# Patient Record
Sex: Male | Born: 1960
Health system: Southern US, Community
[De-identification: ages and names within clinical notes are randomized; demographics above are authoritative.]

---

## 2001-10-03 ENCOUNTER — Encounter: Admission: RE | Admit: 2001-10-03 | Discharge: 2001-10-03 | Payer: Self-pay | Admitting: Family Medicine

## 2001-10-03 ENCOUNTER — Encounter: Payer: Self-pay | Admitting: Family Medicine

## 2008-04-08 ENCOUNTER — Encounter: Admission: RE | Admit: 2008-04-08 | Discharge: 2008-04-08 | Payer: Self-pay | Admitting: Family Medicine

## 2009-04-27 ENCOUNTER — Encounter: Admission: RE | Admit: 2009-04-27 | Discharge: 2009-04-27 | Payer: Self-pay | Admitting: Family Medicine

## 2009-10-07 ENCOUNTER — Encounter: Admission: RE | Admit: 2009-10-07 | Discharge: 2009-10-07 | Payer: Self-pay | Admitting: Family Medicine

## 2010-03-14 ENCOUNTER — Encounter: Admission: RE | Admit: 2010-03-14 | Discharge: 2010-03-14 | Payer: Self-pay | Admitting: Gastroenterology

## 2010-12-21 IMAGING — CR DG UGI W/ HIGH DENSITY W/KUB
12 series · 12 of 12 positions shown · non-contrast
Comparison: None.

CLINICAL DATA: Vomiting liquids.

UPPER GI SERIES WITH KUB
TECHNIQUE: Routine upper GI series was performed with high density
barium.
Fluoroscopy Time: 1.8 minutes

[run (1 of 11)]
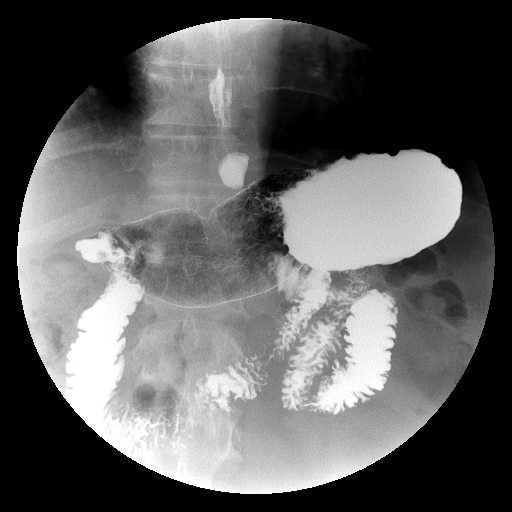

[run (2 of 11)]
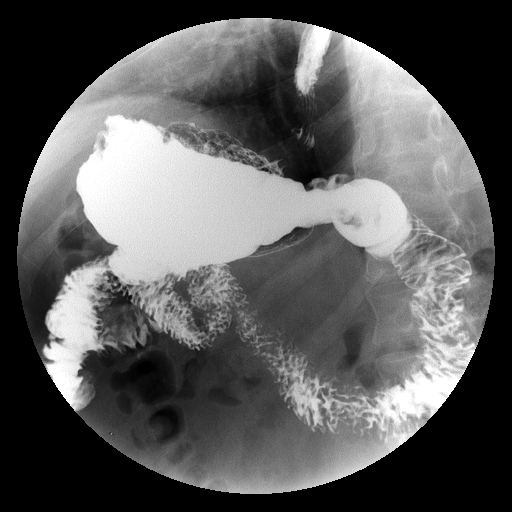

[run (3 of 11)]
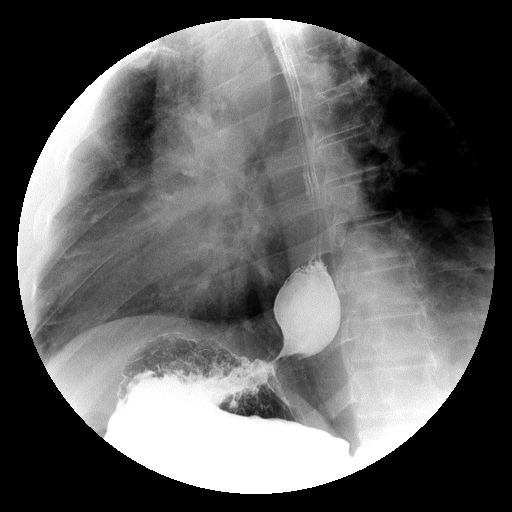

[run (4 of 11)]
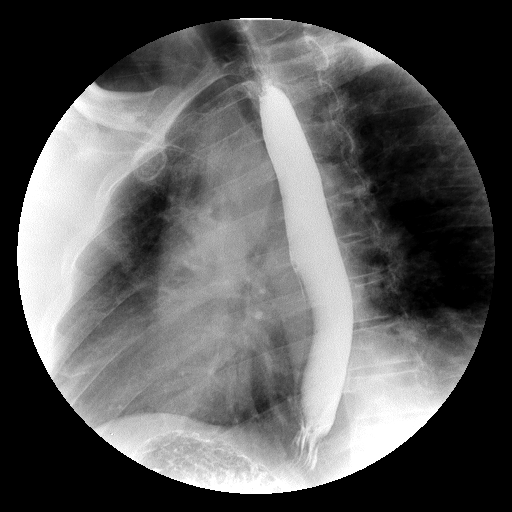

[run (5 of 11)]
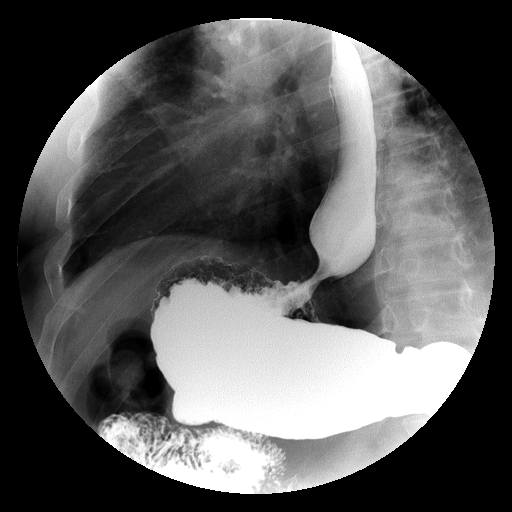

[run (6 of 11)]
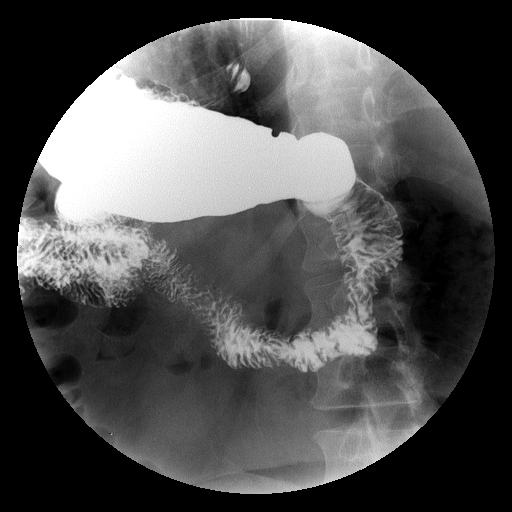

[run (7 of 11)]
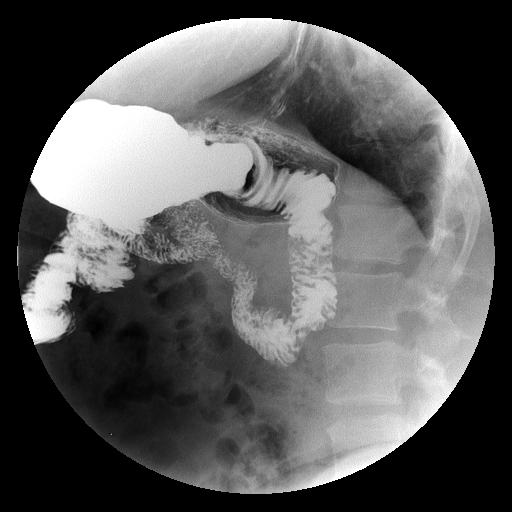

[run (8 of 11)]
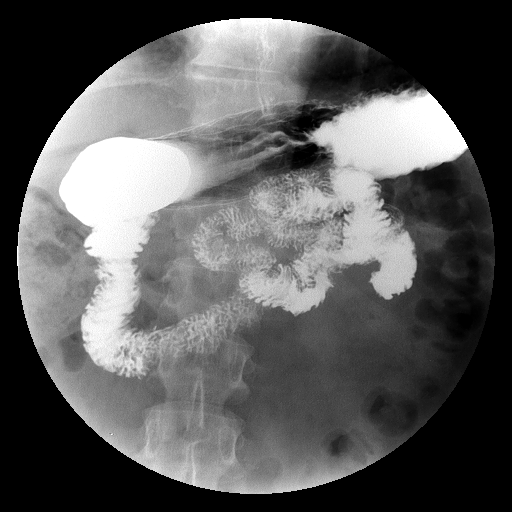

[run (9 of 11)]
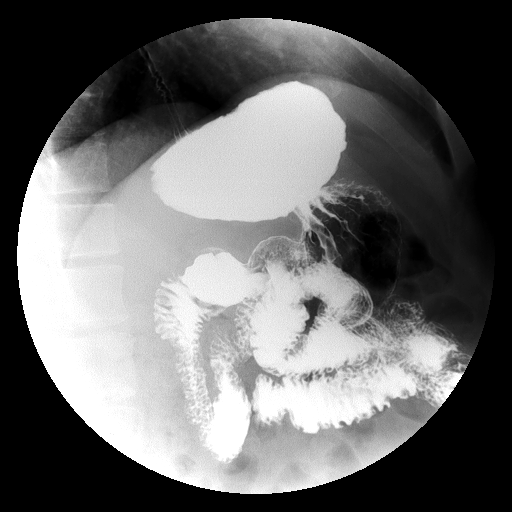

[run (10 of 11)]
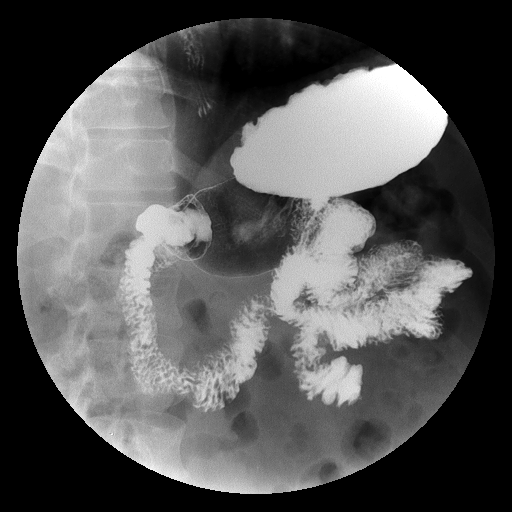

[run (11 of 11)]
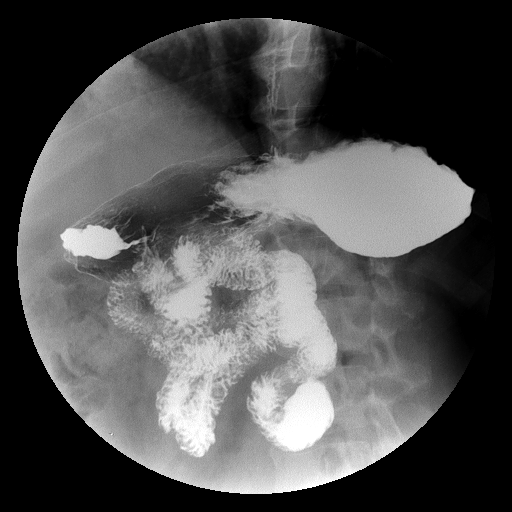

[view not recorded]
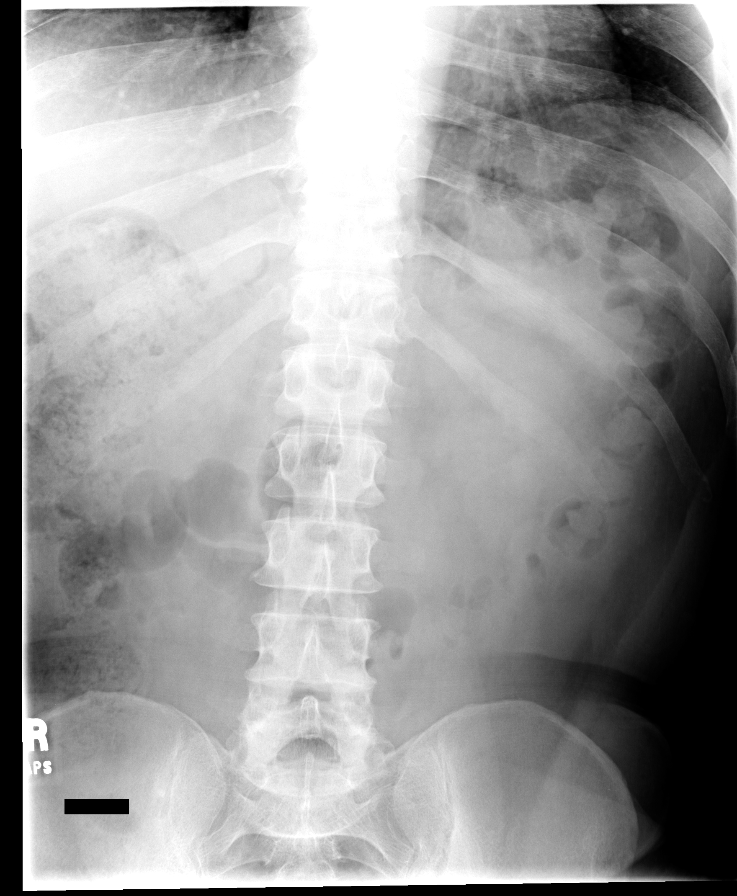

[12 of 12 positions shown; findings below may reference images not displayed]

FINDINGS: Preliminary film is unremarkable.  Normal esophageal
motility and contours.  No stricture.  Minimal sliding type hiatal
hernia.  No GE reflux was appreciated at fluoroscopy.  No gastric
or duodenal abnormality.
IMPRESSION: Minimal hiatal hernia.  Normal esophageal motility.  Normal stomach
and duodenum.

## 2016-04-02 DIAGNOSIS — K080 Exfoliation of teeth due to systemic causes: Secondary | ICD-10-CM | POA: Diagnosis not present

## 2016-04-07 DIAGNOSIS — K080 Exfoliation of teeth due to systemic causes: Secondary | ICD-10-CM | POA: Diagnosis not present

## 2016-05-06 DIAGNOSIS — R21 Rash and other nonspecific skin eruption: Secondary | ICD-10-CM | POA: Diagnosis not present

## 2016-08-26 DIAGNOSIS — E669 Obesity, unspecified: Secondary | ICD-10-CM | POA: Diagnosis not present

## 2016-08-26 DIAGNOSIS — Z23 Encounter for immunization: Secondary | ICD-10-CM | POA: Diagnosis not present

## 2016-08-26 DIAGNOSIS — Z125 Encounter for screening for malignant neoplasm of prostate: Secondary | ICD-10-CM | POA: Diagnosis not present

## 2016-08-26 DIAGNOSIS — Z0000 Encounter for general adult medical examination without abnormal findings: Secondary | ICD-10-CM | POA: Diagnosis not present

## 2016-08-26 DIAGNOSIS — E039 Hypothyroidism, unspecified: Secondary | ICD-10-CM | POA: Diagnosis not present

## 2016-08-26 DIAGNOSIS — Z1211 Encounter for screening for malignant neoplasm of colon: Secondary | ICD-10-CM | POA: Diagnosis not present

## 2016-08-26 DIAGNOSIS — E782 Mixed hyperlipidemia: Secondary | ICD-10-CM | POA: Diagnosis not present

## 2016-10-08 DIAGNOSIS — K080 Exfoliation of teeth due to systemic causes: Secondary | ICD-10-CM | POA: Diagnosis not present

## 2016-10-29 DIAGNOSIS — K080 Exfoliation of teeth due to systemic causes: Secondary | ICD-10-CM | POA: Diagnosis not present

## 2017-03-03 DIAGNOSIS — E782 Mixed hyperlipidemia: Secondary | ICD-10-CM | POA: Diagnosis not present

## 2017-03-03 DIAGNOSIS — E669 Obesity, unspecified: Secondary | ICD-10-CM | POA: Diagnosis not present

## 2017-03-03 DIAGNOSIS — E039 Hypothyroidism, unspecified: Secondary | ICD-10-CM | POA: Diagnosis not present

## 2017-03-12 DIAGNOSIS — R7301 Impaired fasting glucose: Secondary | ICD-10-CM | POA: Diagnosis not present

## 2017-03-18 DIAGNOSIS — R7303 Prediabetes: Secondary | ICD-10-CM | POA: Diagnosis not present

## 2017-04-22 DIAGNOSIS — K080 Exfoliation of teeth due to systemic causes: Secondary | ICD-10-CM | POA: Diagnosis not present

## 2017-05-05 DIAGNOSIS — E1165 Type 2 diabetes mellitus with hyperglycemia: Secondary | ICD-10-CM | POA: Diagnosis not present

## 2017-05-05 DIAGNOSIS — M7060 Trochanteric bursitis, unspecified hip: Secondary | ICD-10-CM | POA: Diagnosis not present

## 2017-05-17 DIAGNOSIS — E119 Type 2 diabetes mellitus without complications: Secondary | ICD-10-CM | POA: Diagnosis not present

## 2017-06-17 DIAGNOSIS — R21 Rash and other nonspecific skin eruption: Secondary | ICD-10-CM | POA: Diagnosis not present

## 2017-06-17 DIAGNOSIS — B353 Tinea pedis: Secondary | ICD-10-CM | POA: Diagnosis not present

## 2017-06-22 DIAGNOSIS — R21 Rash and other nonspecific skin eruption: Secondary | ICD-10-CM | POA: Diagnosis not present

## 2017-07-07 DIAGNOSIS — L308 Other specified dermatitis: Secondary | ICD-10-CM | POA: Diagnosis not present

## 2017-09-15 DIAGNOSIS — E119 Type 2 diabetes mellitus without complications: Secondary | ICD-10-CM | POA: Diagnosis not present

## 2017-09-15 DIAGNOSIS — Z125 Encounter for screening for malignant neoplasm of prostate: Secondary | ICD-10-CM | POA: Diagnosis not present

## 2017-09-15 DIAGNOSIS — E782 Mixed hyperlipidemia: Secondary | ICD-10-CM | POA: Diagnosis not present

## 2017-09-15 DIAGNOSIS — E039 Hypothyroidism, unspecified: Secondary | ICD-10-CM | POA: Diagnosis not present

## 2017-09-15 DIAGNOSIS — Z0000 Encounter for general adult medical examination without abnormal findings: Secondary | ICD-10-CM | POA: Diagnosis not present

## 2017-09-15 DIAGNOSIS — Z23 Encounter for immunization: Secondary | ICD-10-CM | POA: Diagnosis not present

## 2017-09-21 DIAGNOSIS — R319 Hematuria, unspecified: Secondary | ICD-10-CM | POA: Diagnosis not present

## 2017-11-11 DIAGNOSIS — K080 Exfoliation of teeth due to systemic causes: Secondary | ICD-10-CM | POA: Diagnosis not present

## 2018-03-23 DIAGNOSIS — E039 Hypothyroidism, unspecified: Secondary | ICD-10-CM | POA: Diagnosis not present

## 2018-03-23 DIAGNOSIS — E782 Mixed hyperlipidemia: Secondary | ICD-10-CM | POA: Diagnosis not present

## 2018-03-23 DIAGNOSIS — E119 Type 2 diabetes mellitus without complications: Secondary | ICD-10-CM | POA: Diagnosis not present

## 2018-05-25 DIAGNOSIS — M7661 Achilles tendinitis, right leg: Secondary | ICD-10-CM | POA: Diagnosis not present

## 2018-05-31 DIAGNOSIS — E119 Type 2 diabetes mellitus without complications: Secondary | ICD-10-CM | POA: Diagnosis not present

## 2018-06-02 ENCOUNTER — Ambulatory Visit: Payer: Self-pay | Admitting: *Deleted

## 2018-06-02 ENCOUNTER — Encounter: Payer: Self-pay | Admitting: Sports Medicine

## 2018-06-02 ENCOUNTER — Ambulatory Visit: Payer: Self-pay

## 2018-06-02 ENCOUNTER — Ambulatory Visit: Payer: Federal, State, Local not specified - PPO | Admitting: Sports Medicine

## 2018-06-02 VITALS — BP 118/86 | HR 71 | Ht 68.25 in | Wt 209.0 lb

## 2018-06-02 DIAGNOSIS — M7661 Achilles tendinitis, right leg: Secondary | ICD-10-CM | POA: Diagnosis not present

## 2018-06-02 DIAGNOSIS — M7660 Achilles tendinitis, unspecified leg: Secondary | ICD-10-CM

## 2018-06-02 DIAGNOSIS — M6788 Other specified disorders of synovium and tendon, other site: Secondary | ICD-10-CM

## 2018-06-02 MED ORDER — NITROGLYCERIN 0.2 MG/HR TD PT24: MEDICATED_PATCH | TRANSDERMAL | 1 refills | Status: AC

## 2018-06-02 NOTE — Progress Notes (Signed)
LIMITED MSK ULTRASOUND OF Right achilles Images were obtained and interpreted by myself, Gaspar BiddingMichael Aubre Quincy, DO  Images have been saved and stored to PACS system. Images obtained on: GE S7 Ultrasound machine  FINDINGS:   0.62cm at its greatest diameter however most significant findings are at the insertion of the Achilles with significant fragmentation consistent with insertional tendinitis  IMPRESSION:  1. Achilles tendinopathy with overlying insertional tendinitis

## 2018-06-02 NOTE — Telephone Encounter (Signed)
Patient called with questions regarding taking erectile dysfunction medication while he is using nitroglycerin patch prescribed by Dr. Berline Choughigby for achilles tendon healing. Advised not to use both of the medications at the same time. Answered all questions for patient. His PCP is Cam HaiKimberly Shaw at [facility].

## 2018-06-02 NOTE — Progress Notes (Signed)
Veverly FellsMichael D. [name], DO  [facility] [phone]  James Reeves - 57 y.o. male MRN [id number]  Date of birth: 02-23-1961  Visit Date: 06/02/2018  PCP: Lupita RaiderShaw, Kimberlee, MD   Referred by: Lupita RaiderShaw, Kimberlee, MD  Scribe(s) for today's visit: Christoper FabianMolly Weber, LAT, ATC  SUBJECTIVE:  James Reeves is here for New Patient (Initial Visit) (R Achille's pain) .  Referred by: Dr. Cam HaiKimberly Shaw  His R Achille's pain symptoms INITIALLY: Began about 3 months ago w/ no specific MOI.  May be due to walking for exercise while wearing some [place] boat shoes. Described as moderate tightness that is worse upon initially getting up from a seated or supine position, nonradiating Worsened with squatting Improved with walking Additional associated symptoms include: no swelling noted; no mechanical symptoms noted; no N/T     At this time symptoms are improving compared to onset  He has not been doing much in terms of treatment for his R Achille's.   His L foot pain (sup and lat L foot) symptoms INITIALLY: Began about 2.5 weeks ago while he was bowling.  He recalls no specific MOI but may have rolled his L foot and ankle when he stepped on the lane divider while bowling.  He reports not noticing the pain until the end of the evening while he was bowling. Described as moderate burning pain, nonradiating Worsened with walking and weight bearing activity Improved with rest and sleep Additional associated symptoms include: no swelling and no N/T noted in the L foot    At this time symptoms show no change compared to onset  He has not been doing anything for the L foot.   REVIEW OF SYSTEMS: Denies night time disturbances. Denies fevers, chills, or night sweats. Denies unexplained weight loss. Denies personal history of cancer. Denies changes in bowel or bladder habits. Denies recent unreported falls. Denies new or worsening dyspnea or wheezing. Denies  headaches or dizziness.  Denies numbness, tingling or weakness  In the extremities.  Denies dizziness or presyncopal episodes Denies lower extremity edema    HISTORY:  Prior history reviewed and updated per electronic medical record.  Social History   Occupational History  . Not on file  Tobacco Use  . Smoking status: Never Smoker  . Smokeless tobacco: Never Used  Substance and Sexual Activity  . Alcohol use: Not on file  . Drug use: Not on file  . Sexual activity: Not on file   Social History   Social History Narrative  . Not on file    History reviewed. No pertinent past medical history. History reviewed. No pertinent surgical history. family history is not on file.  DATA OBTAINED & REVIEWED:  No results for input(s): HGBA1C, LABURIC, CREATINE in the last 8760 hours. .   OBJECTIVE:  VS:  HT:5' 8.25" (173.4 cm)   WT:209 lb (94.8 kg)  BMI:31.53    BP:118/86  HR:71bpm  TEMP: ( )  RESP:97 %   PHYSICAL EXAM: CONSTITUTIONAL: Well-developed, Well-nourished and In no acute distress PSYCHIATRIC: Alert & appropriately interactive. and Not depressed or anxious appearing. RESPIRATORY: No increased work of breathing and Trachea Midline EYES: Pupils are equal., EOM intact without nystagmus. and No scleral icterus.  VASCULAR EXAM: Warm and well perfused NEURO: unremarkable  MSK Exam: Right Foot & Ankle Exam: No significant rashes/lesions/ulcerations overlying the examined area No overlying erythema/ecchymosis.   General Alignment: Normal alignment & Contours Long Arch: High, rigid Hind Foot Alignment: Normal Posterior Tibialis  Recruitment: normal Transverse Arch: normal Palpation:   Navicular: nontender Base of the 5th metatarsal: nontender Posterior aspect of the MEDIAL Malleolus: nontender Posterior aspect of the LATERAL Malleolus: nontender Marked pain with palpation of the insertion of the Achilles.  Only minimal pain with palpation of the avascular  zone.  ROM: Equinus contracture to: 85 degrees on the right, 110 degrees on the left Strength: No significant weakness with Inversion, eversion, dorsiflexion and plantar flexion Ankle Stablity: stable to testing and No significant pain with midfoot abduction    ASSESSMENT   1. Right Achilles tendinitis   2. Achilles tendinosis     PLAN:  Pertinent additional documentation may be included in corresponding procedure notes, imaging studies, problem based documentation and patient instructions.  Procedures:  . Discussed the foundation of treatment for this condition is physical therapy and/or daily (5-6 days/week) therapeutic exercises, focusing on core strengthening, coordination, neuromuscular control/reeducation.  Therapeutic exercises prescribed per procedure note.  Medications:  Meds ordered this encounter  Medications  . nitroGLYCERIN (NITRODUR - DOSED IN MG/24 HR) 0.2 mg/hr patch    Sig: Place 1/4 to 1/2 of a patch over affected region. Remove and replace once daily.  Slightly alter skin placement daily    Dispense:  30 patch    Refill:  1    For musculoskeletal purposes.  Okay to cut patch.   Discussion/Instructions: No problem-specific Assessment & Plan notes found for this encounter.  . Discussed options with the patient today including biologic treatment with topical nitroglycerin. Patient has no contraindications & understands the risks, benefits and intentions of treatment. Emphasized the importance of rotating sites as well as appropriate and expected adverse reactions including orthostasis, headache, adhesive sensitivity. Begin with 1/4 patch to the affected area. Okay to titrate to half a patch as tolerated.   . We did discuss that due to the nature of insertional tendinitis alternative options may be can considered however with the significant thickening and data from avascular zone tendinopathy nitroglycerin protocol is worthwhile trying.  If any lack of improvement  will need to consider alternative options. . Conditioning . Discussed red flag symptoms that warrant earlier emergent evaluation and patient voices understanding. . Activity modifications and the importance of avoiding exacerbating activities (limiting pain to no more than a 4 / 10 during or following activity) recommended and discussed.  Follow-up:  . Return in about 6 weeks (around 07/14/2018).  . If any lack of improvement consider: . Could consider referral for Tenex and/or further diagnostic evaluation     CMA/ATC served as scribe during this visit. History, Physical, and Plan performed by medical provider. Documentation and orders reviewed and attested to.      Andrena Mews, DO    [facility] Physician

## 2018-06-02 NOTE — Progress Notes (Signed)
PROCEDURE NOTE: THERAPEUTIC EXERCISES (97110) 15 minutes spent for Therapeutic exercises as below and as referenced in the AVS.  This included exercises focusing on stretching, strengthening, with significant focus on eccentric aspects.   Proper technique shown and discussed handout in great detail with ATC.  All questions were discussed and answered.   Long term goals include an improvement in range of motion, strength, endurance as well as avoiding reinjury. Frequency of visits is one time as determined during today's  office visit. Frequency of exercises to be performed is as per handout.  EXERCISES REVIEWED:  [place] Protocol 

## 2018-06-02 NOTE — Patient Instructions (Addendum)
Nitroglycerin Protocol   Apply 1/4 nitroglycerin patch to affected area daily.  Change position of patch within the affected area every 24 hours.  You may experience a headache during the first 1-2 weeks of using the patch, these should subside.  If you experience headaches after beginning nitroglycerin patch treatment, you may take your preferred over the counter pain reliever.  Another side effect of the nitroglycerin patch is skin irritation or rash related to patch adhesive.  Please notify our office if you develop more severe headaches or rash, and stop the patch.  Tendon healing with nitroglycerin patch may require 12 to 24 weeks depending on the extent of injury.  Men should not use if taking Viagra, Cialis, or Levitra.   Do not use if you have migraines or rosacea.     Please perform the exercise program that we have prepared for you and gone over in detail on a daily basis.  In addition to the handout you were provided you can access your program through: [email or web address]   Your unique program code is:  [email or web address]

## 2018-06-09 DIAGNOSIS — K080 Exfoliation of teeth due to systemic causes: Secondary | ICD-10-CM | POA: Diagnosis not present

## 2018-06-24 ENCOUNTER — Other Ambulatory Visit: Payer: Self-pay | Admitting: Sports Medicine

## 2018-07-15 ENCOUNTER — Telehealth: Payer: Self-pay | Admitting: Sports Medicine

## 2018-07-15 NOTE — Telephone Encounter (Signed)
Will fax once OV note has been signed.

## 2018-07-15 NOTE — Telephone Encounter (Signed)
See note.   Copied from CRM [id number]. Topic: General - Other >> Jul 15, 2018  9:13 AM Burchel, Abbi R wrote: Reason for CRM:   [facility] called to request OV Note from 06/02/18.  Please fax: [phone]

## 2018-07-21 ENCOUNTER — Encounter: Payer: Self-pay | Admitting: Sports Medicine

## 2018-07-21 ENCOUNTER — Ambulatory Visit: Payer: Federal, State, Local not specified - PPO | Admitting: Sports Medicine

## 2018-07-21 ENCOUNTER — Ambulatory Visit: Payer: Self-pay

## 2018-07-21 VITALS — BP 110/80 | HR 83 | Ht 68.25 in | Wt 211.8 lb

## 2018-07-21 DIAGNOSIS — M6788 Other specified disorders of synovium and tendon, other site: Secondary | ICD-10-CM

## 2018-07-21 DIAGNOSIS — M7661 Achilles tendinitis, right leg: Secondary | ICD-10-CM | POA: Insufficient documentation

## 2018-07-21 DIAGNOSIS — M7660 Achilles tendinitis, unspecified leg: Secondary | ICD-10-CM

## 2018-07-21 MED ORDER — DICLOFENAC EPOLAMINE 1.3 % TD PTCH: 1.0000 | MEDICATED_PATCH | Freq: Two times a day (BID) | TRANSDERMAL | 1 refills | Status: DC

## 2018-07-21 NOTE — Progress Notes (Signed)
James Reeves. [facility] [place] [id number]  Loraine Freid - 57 y.o. male MRN [id number]  Date of birth: [birth year]  Visit Date: 07/21/2018  PCP: Lupita Raider, MD   Referred by: Lupita Raider, MD  Scribe(s) for today's visit: Stevenson Clinch, CMA  SUBJECTIVE:  James Reeves is here for Follow-up (R achilles)   06/02/2018: His R Achille's pain symptoms INITIALLY: Began about 3 months ago w/ no specific MOI.  May be due to walking for exercise while wearing some [place] boat shoes. Described as moderate tightness that is worse upon initially getting up from a seated or supine position, nonradiating Worsened with squatting Improved with walking Additional associated symptoms include: no swelling noted; no mechanical symptoms noted; no N/T    At this time symptoms are improving compared to onset  He has not been doing much in terms of treatment for his R Achille's. His L foot pain (sup and lat L foot) symptoms INITIALLY: Began about 2.5 weeks ago while he was bowling.  He recalls no specific MOI but may have rolled his L foot and ankle when he stepped on the lane divider while bowling.  He reports not noticing the pain until the end of the evening while he was bowling. Described as moderate burning pain, nonradiating Worsened with walking and weight bearing activity Improved with rest and sleep Additional associated symptoms include: no swelling and no N/T noted in the L foot   At this time symptoms show no change compared to onset  He has not been doing anything for the L foot.  07/21/2018: Compared to the last office visit, his previously described symptoms are worsening. He has noticed increased stiffness after doing HEP. At lot times he is unable to "walk it off".  Current symptoms are moderate-severe & are non-radiating. He has been following Nitro Protocol with no change. He reports no side effects to nitroglycerin  patches.    REVIEW OF SYSTEMS: Denies night time disturbances. Denies fevers, chills, or night sweats. Denies unexplained weight loss. Denies personal history of cancer. Denies changes in bowel or bladder habits. Denies recent unreported falls. Denies new or worsening dyspnea or wheezing. Denies headaches or dizziness.  Denies numbness, tingling or weakness  In the extremities.  Denies dizziness or presyncopal episodes Denies lower extremity edema     HISTORY & PERTINENT PRIOR DATA:  Significant/pertinent history, findings, studies include:  reports that he has never smoked. He has never used smokeless tobacco. No results for input(s): HGBA1C, LABURIC, CREATINE in the last 8760 hours. No specialty comments available. Problem  Right Achilles Tendinitis    Otherwise prior history reviewed and updated per electronic medical record.    OBJECTIVE:  VS:  HT:5' 8.25" (173.4 cm)   WT:211 lb 12.8 oz (96.1 kg)  BMI:31.95    BP:110/80  HR:83bpm  TEMP: ( )  RESP:96 %   PHYSICAL EXAM: CONSTITUTIONAL: Well-developed, Well-nourished and In no acute distress Alert & appropriately interactive. and Not depressed or anxious appearing. RESPIRATORY: No increased work of breathing and Trachea Midline EYES: Pupils are equal., EOM intact without nystagmus. and No scleral icterus.  Lower extremities: Warm and well perfused Pulses: DP Pulses: Bilaterally normal and symmetric PT Pulses: Bilaterally normal and symmetric Edema: No Pre-tibial edema and No significant swelling or edema NEURO: unremarkable Normal associated myotomal distribution strength to manual muscle testing Normal sensation to light touch  MSK Exam: Right Achilles/ankle: . Well aligned, no significant deformity. Marland Kitchen No  overlying skin changes. . Focally tender over the posterior aspect of the calcaneus.  No pain with calcaneal squeeze test.  No significant tenderness over the avascular zone of the Achilles although  mild. .  .  .    PROCEDURES & DATA REVIEWED:  . Diagnostic Ultrasound performed today per procedure note  ASSESSMENT   1. Achilles tendinosis   2. Right Achilles tendinitis     PLAN:       . Patient ultimately has both avascular zone Achilles tendinosis as well as insertional Achilles tendinitis.  Given the lack of evidence in support of treatment for insertional Achilles tendinitis we will transition to more anti-inflammatory effect with a Flector patch to see if this will provide increased benefit. . Long discussion today regarding treatment options and lack of evidence around treatment for this condition.  He should continue with cool water soaking, back off on his therapeutic exercises as baseline could be exacerbating his symptoms and once again trial of topical NSAID. Marland Kitchen. Can consider retrocalcaneal injection if he is interested but did discuss the increased risks of tendon rupture. . Can consider continuing nitroglycerin protocol we did discuss that treatment with this is extrapolated from avascular zone tendinosis. . >50% of this 25 minutes minute visit spent in direct patient counseling and/or coordination of care. Discussion was focused on education regarding the in discussing the pathoetiology and anticipated clinical course of the above condition. No problem-specific Assessment & Plan notes found for this encounter.   Follow-up: Return in about 6 weeks (around 09/01/2018).      Please see additional documentation for Objective, Assessment and Plan sections. Pertinent additional documentation may be included in corresponding procedure notes, imaging studies, problem based documentation and patient instructions. Please see these sections of the encounter for additional information regarding this visit.  CMA/ATC served as Neurosurgeonscribe during this visit. History, Physical, and Plan performed by medical provider. Documentation and orders reviewed and attested to.      Andrena MewsMichael D  Davanee Klinkner, DO    [facility] Physician

## 2018-07-21 NOTE — Procedures (Addendum)
LIMITED MSK ULTRASOUND OF Right ankle Images were obtained and interpreted by myself, Gaspar BiddingMichael Cele Mote, DO  Images have been saved and stored to PACS system. Images obtained on: GE S7 Ultrasound machine  FINDINGS:   Achilles is normal appearing at the avascular zone.  He has insertional changes with hypoechoic change as well as calcific changes consistent with a bone spur.    There is mild overlying neovascularity.  IMPRESSION:  1. Insertional Achilles tendinopathy, persistent change without significant interstitial neovascularity.

## 2018-07-21 NOTE — Patient Instructions (Addendum)
Frequent icing is recommended.  You can ice for 10-15 minutes at a time 3-4 times per day if needed.  It is important to be sure to ice immediately after activity. For the foot and ankle it is sometimes easier and more effective to soak your foot in a bucket of cool water.   The water should not be miserably cold, a good rule to go by his if there is any ice floating by the end of the 10-15 minutes it was likely too cold.  

## 2018-08-08 ENCOUNTER — Encounter: Payer: Self-pay | Admitting: Sports Medicine

## 2018-09-01 ENCOUNTER — Ambulatory Visit: Payer: Federal, State, Local not specified - PPO | Admitting: Sports Medicine

## 2018-09-01 ENCOUNTER — Encounter: Payer: Self-pay | Admitting: Sports Medicine

## 2018-09-01 VITALS — BP 132/84 | HR 77 | Ht 68.25 in | Wt 214.0 lb

## 2018-09-01 DIAGNOSIS — M25872 Other specified joint disorders, left ankle and foot: Secondary | ICD-10-CM | POA: Diagnosis not present

## 2018-09-01 DIAGNOSIS — M7661 Achilles tendinitis, right leg: Secondary | ICD-10-CM | POA: Diagnosis not present

## 2018-09-01 DIAGNOSIS — M6788 Other specified disorders of synovium and tendon, other site: Secondary | ICD-10-CM | POA: Diagnosis not present

## 2018-09-01 NOTE — Progress Notes (Signed)
Veverly Fells. [facility] at [facility] [phone]  Gianno Volner - 57 y.o. male MRN [id number]  Date of birth: 10-04-1961  Visit Date: 09/01/2018  PCP: Lupita Raider, MD   Referred by: Lupita Raider, MD  Scribe(s) for today's visit: Christoper Fabian, LAT, ATC  SUBJECTIVE:  Jaqwon Manfred is here for Follow-up (R Achille's )   06/02/2018: His R Achille's pain symptoms INITIALLY: Began about 3 months ago w/ no specific MOI.  May be due to walking for exercise while wearing some [place] boat shoes. Described as moderate tightness that is worse upon initially getting up from a seated or supine position, nonradiating Worsened with squatting Improved with walking Additional associated symptoms include: no swelling noted; no mechanical symptoms noted; no N/T    At this time symptoms are improving compared to onset  He has not been doing much in terms of treatment for his R Achille's. His L foot pain (sup and lat L foot) symptoms INITIALLY: Began about 2.5 weeks ago while he was bowling.  He recalls no specific MOI but may have rolled his L foot and ankle when he stepped on the lane divider while bowling.  He reports not noticing the pain until the end of the evening while he was bowling. Described as moderate burning pain, nonradiating Worsened with walking and weight bearing activity Improved with rest and sleep Additional associated symptoms include: no swelling and no N/T noted in the L foot   At this time symptoms show no change compared to onset  He has not been doing anything for the L foot.  07/21/2018: Compared to the last office visit, his previously described symptoms are worsening. He has noticed increased stiffness after doing HEP. At lot times he is unable to "walk it off".  Current symptoms are moderate-severe & are non-radiating. He has been following Nitro Protocol with no change. He reports no side effects to nitroglycerin  patches.   09/01/2018: Compared to the last office visit on 07/21/18, his previously described R Achille's symptoms are improving but "he doesn't know why."  He was using a combination of the nitroglycerin patches and the Flector patches but wasn't using the nitro patches consistently and started getting headaches which he felt was due to the nitroglycerin patches so he stopped using the nitroglycerin patches.  He states that he isn't limping as badly but states that he "feels it more."  He also reports that he intermittently feels that he has a sore on his R Achille's due to a burning pain that he experiences. Current symptoms are moderate & are nonradiating He has been using the Flector patch and feels like this is helping.  He has continued to ice and feels that this is helpful.  REVIEW OF SYSTEMS: Denies night time disturbances. Denies fevers, chills, or night sweats. Denies unexplained weight loss. Denies personal history of cancer. Denies changes in bowel or bladder habits. Denies recent unreported falls. Denies new or worsening dyspnea or wheezing. Denies headaches or dizziness.  Denies numbness, tingling or weakness  In the extremities.  Denies dizziness or presyncopal episodes Denies lower extremity edema     HISTORY & PERTINENT PRIOR DATA:  Significant/pertinent history, findings, studies include:  reports that he has never smoked. He has never used smokeless tobacco. No results for input(s): HGBA1C, LABURIC, CREATINE in the last 8760 hours. No specialty comments available. No problems updated.  Otherwise prior history reviewed and updated per electronic medical record.  OBJECTIVE:  VS:  HT:5' 8.25" (173.4 cm)   WT:214 lb (97.1 kg)  BMI:32.28    BP:132/84  HR:77bpm  TEMP: ( )  RESP:94 %   PHYSICAL EXAM: CONSTITUTIONAL: Well-developed, Well-nourished and In no acute distress Psychiatric: Alert & appropriately interactive. and Not depressed or anxious  appearing. RESPIRATORY: No increased work of breathing and Trachea Midline EYES: Pupils are equal., EOM intact without nystagmus. and No scleral icterus.  Lower extremities: EXTREMITY EXAM: Warm and well perfused Pulses: DP Pulses: Bilaterally normal and symmetric PT Pulses: Bilaterally normal and symmetric Edema: No Pre-tibial edema and No significant swelling or edema NEURO: unremarkable Normal associated myotomal distribution strength to manual muscle testing Normal sensation to light touch  MSK Exam: Right Achilles/ankle: . Well aligned, no significant deformity. . No overlying skin changes. . Focally tender over the posterior aspect of the calcaneus.  No pain with calcaneal squeeze test.  No significant tenderness over the avascular zone of the Achilles although mild.  . Mild TTP over the anterior medial and lateral ankle moderately.  Ankle stabile to ligamentous testing   PROCEDURES & DATA REVIEWED:  . None  ASSESSMENT   1. Achilles tendinosis   2. Right Achilles tendinitis   3. Ankle impingement syndrome, left     PLAN:  . Having moderate improvement on the flector patch.  Continue with RICE and flector patch.  add achilles stretching back into daily exercise. Discussed this will continue to take time to completely heal given both avascular zone and insertional tendinitis. . Ankle impingement secondary to mild over pronation.OTC insols recommended and handout provided . Discussed red flag symptoms that warrant earlier emergent evaluation and patient voices understanding. . Activity modifications and the importance of avoiding exacerbating activities (limiting pain to no more than a 4 / 10 during or following activity) recommended and discussed. Marland Kitchen RICE (Rest, ICE, Compression, Elevation) principles reviewed with the patient. No problem-specific Assessment & Plan notes found for this encounter.   Follow-up: Return in about 6 weeks (around 10/13/2018).      Please see  additional documentation for Objective, Assessment and Plan sections. Pertinent additional documentation may be included in corresponding procedure notes, imaging studies, problem based documentation and patient instructions. Please see these sections of the encounter for additional information regarding this visit.  CMA/ATC served as Neurosurgeon during this visit. History, Physical, and Plan performed by medical provider. Documentation and orders reviewed and attested to.      Andrena Mews, DO    [place] [facility] Phys[facility]

## 2018-09-28 DIAGNOSIS — Z0000 Encounter for general adult medical examination without abnormal findings: Secondary | ICD-10-CM | POA: Diagnosis not present

## 2018-09-28 DIAGNOSIS — E039 Hypothyroidism, unspecified: Secondary | ICD-10-CM | POA: Diagnosis not present

## 2018-09-28 DIAGNOSIS — Z125 Encounter for screening for malignant neoplasm of prostate: Secondary | ICD-10-CM | POA: Diagnosis not present

## 2018-09-28 DIAGNOSIS — Z23 Encounter for immunization: Secondary | ICD-10-CM | POA: Diagnosis not present

## 2018-09-28 DIAGNOSIS — E1169 Type 2 diabetes mellitus with other specified complication: Secondary | ICD-10-CM | POA: Diagnosis not present

## 2018-09-28 DIAGNOSIS — E782 Mixed hyperlipidemia: Secondary | ICD-10-CM | POA: Diagnosis not present

## 2018-10-13 ENCOUNTER — Ambulatory Visit: Payer: Federal, State, Local not specified - PPO | Admitting: Sports Medicine

## 2018-10-27 ENCOUNTER — Encounter: Payer: Self-pay | Admitting: Sports Medicine

## 2018-10-27 ENCOUNTER — Ambulatory Visit: Payer: Federal, State, Local not specified - PPO | Admitting: Sports Medicine

## 2018-10-27 VITALS — BP 120/82 | HR 81 | Ht 68.25 in | Wt 213.4 lb

## 2018-10-27 DIAGNOSIS — M7661 Achilles tendinitis, right leg: Secondary | ICD-10-CM

## 2018-10-27 DIAGNOSIS — M6788 Other specified disorders of synovium and tendon, other site: Secondary | ICD-10-CM | POA: Diagnosis not present

## 2018-10-27 NOTE — Progress Notes (Signed)
James FellsMichael D. [name], DO  [facility] [place] [phone]  James Reeves - 57 y.o. male MRN [id number]  Date of birth: Aug 17, 1961  Visit Date:10/27/2018    PCP: Lupita RaiderShaw, Kimberlee, MD   Referred by: Lupita RaiderShaw, Kimberlee, MD   SUBJECTIVE:   Chief Complaint  Patient presents with  . Follow-up    R Achille's tendinosis     HPI: Patient is here for follow-up of right Achilles.  He continues to show moderate improvement although slight.  He is continuing to have moderate pain.  Still continues be worsened with activity and alleviated by rest.  He has been stretching using topical diclofenac and nitroglycerin protocol.  He did have slight irritation with the Flector patch.    REVIEW OF SYSTEMS: Denies night time disturbances. Denies fevers, chills, or night sweats. Denies unexplained weight loss. Denies personal history of cancer. Denies changes in bowel or bladder habits. Denies recent unreported falls. Denies new or worsening dyspnea or wheezing. Denies headaches or dizziness.  Denies numbness, tingling or weakness  In the extremities.  Denies dizziness or presyncopal episodes Denies lower extremity edema   HISTORY:  Prior history reviewed and updated per electronic medical record.  Social History   Occupational History  . Not on file  Tobacco Use  . Smoking status: Never Smoker  . Smokeless tobacco: Never Used  Substance and Sexual Activity  . Alcohol use: Not on file  . Drug use: Not on file  . Sexual activity: Not on file   Social History   Social History Narrative  . Not on file      OBJECTIVE:  VS:  HT:5' 8.25" (173.4 cm)   WT:213 lb 6.4 oz (96.8 kg)  BMI:32.19    BP:120/82  HR:81bpm  TEMP: ( )  RESP:97 %   PHYSICAL EXAM: CONSTITUTIONAL: Well-developed, Well-nourished and In no acute distress PSYCHIATRIC : Alert & appropriately interactive. and Not depressed or anxious appearing. RESPIRATORY : No increased work  of breathing and Trachea Midline EYES : Pupils are equal., EOM intact without nystagmus. and No scleral icterus.  VASCULAR EXAM : Warm and well perfused NEURO: unremarkable  MSK Exam:  Right ankle  Well aligned No significant deformity. No overlying skin changes No focal bony tenderness No swelling No effusion Ligamentous stable He continues to have small amount of pain over the Achilles but overall this is improved.  Mild pain with stretching.     ASSESSMENT   1. Achilles tendinosis   2. Right Achilles tendinitis      PLAN:  Pertinent additional documentation may be included in corresponding procedure notes, imaging studies, problem based documentation and patient instructions.  Procedures:  None  Medications:  No orders of the defined types were placed in this encounter.   Discussion/Instructions: No problem-specific Assessment & Plan notes found for this encounter.  Body Helix Compression Sleeve provided today per AVS Discussed red flag symptoms that warrant earlier emergent evaluation and patient voices understanding. Activity modifications and the importance of avoiding exacerbating activities (limiting pain to no more than a 4 / 10 during or following activity) recommended and discussed.   Ultimately has had fairly good improvement in symptoms and is only having minimal focal pain that is approximately half a centimeter in diameter directly over the Achilles insertion at the calcaneus.  There is a small amount of swelling this area but it is minimal.  Recommend cool water soaking as well as additional padding and support.  We will let  him try body helix compression sleeve today to see if this is beneficial.  We discussed that insertional Achilles tendinitis can take 6 to 12 months to fully heal and given the fact that he has had good improvement I would anticipate him only continue to get better.  He should avoid any exacerbating to that he has including therapeutic  exercises that seem to symptom exacerbating.  If any lack of improvement: consider further diagnostic evaluation with MRI of the Achilles   Return in about 3 months (around 01/26/2019), or if symptoms worsen or fail to improve.          Andrena Mews, DO    [facility]

## 2018-10-27 NOTE — Patient Instructions (Addendum)
I recommend you obtained a compression sleeve to help with your joint problems. There are many options on the market however I recommend obtaining a ankle Body Helix compression sleeve.  You can find information (including how to appropriate measure yourself for sizing) can be found at www.Body Helix.com.  Many of these products are health savings account (HSA) eligible.   You can use the compression sleeve at any time throughout the day but is most important to use while being active as well as for 2 hours post-activity.   It is appropriate to ice following activity with the compression sleeve in place.   

## 2018-12-01 DIAGNOSIS — E039 Hypothyroidism, unspecified: Secondary | ICD-10-CM | POA: Diagnosis not present

## 2018-12-29 DIAGNOSIS — K080 Exfoliation of teeth due to systemic causes: Secondary | ICD-10-CM | POA: Diagnosis not present

## 2019-06-15 DIAGNOSIS — E782 Mixed hyperlipidemia: Secondary | ICD-10-CM | POA: Diagnosis not present

## 2019-06-15 DIAGNOSIS — E039 Hypothyroidism, unspecified: Secondary | ICD-10-CM | POA: Diagnosis not present

## 2019-06-15 DIAGNOSIS — E1169 Type 2 diabetes mellitus with other specified complication: Secondary | ICD-10-CM | POA: Diagnosis not present

## 2019-06-21 DIAGNOSIS — E782 Mixed hyperlipidemia: Secondary | ICD-10-CM | POA: Diagnosis not present

## 2019-06-21 DIAGNOSIS — E669 Obesity, unspecified: Secondary | ICD-10-CM | POA: Diagnosis not present

## 2019-06-21 DIAGNOSIS — E039 Hypothyroidism, unspecified: Secondary | ICD-10-CM | POA: Diagnosis not present

## 2019-06-21 DIAGNOSIS — E1169 Type 2 diabetes mellitus with other specified complication: Secondary | ICD-10-CM | POA: Diagnosis not present

## 2019-07-12 DIAGNOSIS — B078 Other viral warts: Secondary | ICD-10-CM | POA: Diagnosis not present

## 2019-10-11 DIAGNOSIS — Z125 Encounter for screening for malignant neoplasm of prostate: Secondary | ICD-10-CM | POA: Diagnosis not present

## 2019-10-11 DIAGNOSIS — E039 Hypothyroidism, unspecified: Secondary | ICD-10-CM | POA: Diagnosis not present

## 2019-10-11 DIAGNOSIS — E1169 Type 2 diabetes mellitus with other specified complication: Secondary | ICD-10-CM | POA: Diagnosis not present

## 2019-10-11 DIAGNOSIS — E782 Mixed hyperlipidemia: Secondary | ICD-10-CM | POA: Diagnosis not present

## 2019-10-11 DIAGNOSIS — Z0000 Encounter for general adult medical examination without abnormal findings: Secondary | ICD-10-CM | POA: Diagnosis not present

## 2019-10-23 DIAGNOSIS — E119 Type 2 diabetes mellitus without complications: Secondary | ICD-10-CM | POA: Diagnosis not present

## 2019-10-24 ENCOUNTER — Other Ambulatory Visit: Payer: Self-pay | Admitting: Orthopaedic Surgery

## 2019-10-24 ENCOUNTER — Ambulatory Visit: Payer: Federal, State, Local not specified - PPO | Admitting: Orthopaedic Surgery

## 2019-10-24 ENCOUNTER — Other Ambulatory Visit: Payer: Self-pay

## 2019-10-24 ENCOUNTER — Ambulatory Visit: Payer: Self-pay

## 2019-10-24 ENCOUNTER — Encounter: Payer: Self-pay | Admitting: Orthopaedic Surgery

## 2019-10-24 VITALS — Ht 68.0 in | Wt 206.0 lb

## 2019-10-24 DIAGNOSIS — G8929 Other chronic pain: Secondary | ICD-10-CM | POA: Diagnosis not present

## 2019-10-24 DIAGNOSIS — M25512 Pain in left shoulder: Secondary | ICD-10-CM

## 2019-10-24 NOTE — Progress Notes (Signed)
Office Visit Note   Patient: James Reeves           Date of Birth: 08-13-1961           MRN: [id number] Visit Date: 10/24/2019              Requested by: Mayra Neer, MD [address] [place],  [place] [address] PCP: Mayra Neer, MD   Assessment & Plan: Visit Diagnoses:  1. Chronic left shoulder pain     Plan: Impression is symptomatic left AC joint arthrosis.  Based on discussion patient would like to try cortisone injection today.  Dr. Junius Roads performed this under ultrasound guidance.  We will see the patient back as needed if he does not improve.  Questions encouraged and answered.  Follow-Up Instructions: Return if symptoms worsen or fail to improve.   Orders:  No orders of the defined types were placed in this encounter.  No orders of the defined types were placed in this encounter.     Procedures: No procedures performed   Clinical Data: No additional findings.   Subjective: Chief Complaint  Patient presents with  . Left Shoulder - Pain    James Reeves is a very pleasant 58 year old gentleman comes in for evaluation of chronic left shoulder pain.  Years.  He had a remote injury to his left shoulder while weightlifting but this was over 40 years ago.  He states that the pain is on the superior aspect of the shoulder overlying the [facility] joint.  The pain is worse when sleeping on his left side.  Denies any radicular symptoms.  He has not had any injections or physical therapy.  He has taken etodolac with some relief.  Denies any numbness and tingling.  Is right-hand dominant.   Review of Systems  Constitutional: Negative.   All other systems reviewed and are negative.    Objective: Vital Signs: Ht 5\' 8"  (1.727 m)   Wt 206 lb (93.4 kg)   BMI 31.32 kg/m   Physical Exam Vitals signs and nursing note reviewed.  Constitutional:      Appearance: He is well-developed.  Pulmonary:     Effort: Pulmonary effort is normal.  Abdominal:   Palpations: Abdomen is soft.  Skin:    General: Skin is warm.  Neurological:     Mental Status: He is alert and oriented to person, place, and time.  Psychiatric:        Behavior: Behavior normal.        Thought Content: Thought content normal.        Judgment: Judgment normal.     Ortho Exam Left shoulder exam shows full active and passive range of motion without pain.  Rotator cuff is normal to manual muscle testing.  Negative impingement.  Positive cross body adduction.  Slight tenderness to the [facility] joint. Specialty Comments:  No specialty comments available.  Imaging: No results found.   PMFS History: Patient Active Problem List   Diagnosis Date Noted  . Right Achilles tendinitis 07/21/2018   History reviewed. No pertinent past medical history.  History reviewed. No pertinent family history.  History reviewed. No pertinent surgical history. Social History   Occupational History  . Not on file  Tobacco Use  . Smoking status: Never Smoker  . Smokeless tobacco: Never Used  Substance and Sexual Activity  . Alcohol use: Not on file  . Drug use: Not on file  . Sexual activity: Not on file

## 2019-10-24 NOTE — Progress Notes (Signed)
Subjective: Patient is here for ultrasound-guided [facility] joint injection.  Objective:  Tender at left ACJ.  Procedure: Ultrasound-guided left AC injection: After sterile prep with Betadine, injected 5 cc 1% lidocaine without epinephrine and 40 mg methylprednisolone.  Modest immediate relief.  Return as scheduled.

## 2019-10-31 ENCOUNTER — Telehealth: Payer: Self-pay | Admitting: Orthopaedic Surgery

## 2019-10-31 ENCOUNTER — Other Ambulatory Visit: Payer: Self-pay

## 2019-10-31 NOTE — Telephone Encounter (Signed)
Pennsaid.  [facility]

## 2019-10-31 NOTE — Telephone Encounter (Signed)
Which "cream" were you referring to?

## 2019-10-31 NOTE — Telephone Encounter (Signed)
Patient called stated the injection did not work and asked if the cream Dr Erlinda Hong told him about could be sent to the [facility] [address] in [place]? The number to contact patient is [phone]

## 2019-11-02 ENCOUNTER — Other Ambulatory Visit: Payer: Self-pay

## 2019-11-02 MED ORDER — DICLOFENAC SODIUM 2 % EX SOLN: 1.0000 | Freq: Two times a day (BID) | CUTANEOUS | 0 refills | Status: AC

## 2019-11-02 NOTE — Telephone Encounter (Signed)
Sent into pharm. [facility].

## 2019-11-07 ENCOUNTER — Telehealth: Payer: Self-pay | Admitting: Orthopaedic Surgery

## 2019-11-07 NOTE — Telephone Encounter (Signed)
If he has any questions he can call [facility] [phone] (PENNSAID)

## 2019-11-07 NOTE — Telephone Encounter (Signed)
Patient called and stated refill on Diclofenac Sodium cream was sent to wrong pharmacy. [facility] is [facility] [address] Patient phone number is [phone].

## 2019-11-07 NOTE — Telephone Encounter (Signed)
Tried calling twice no answer. Will try again later.

## 2019-11-07 NOTE — Telephone Encounter (Signed)
We did send it to [facility]. The reason why is less $ there and they will mail out to patient. At a regular pharm Rx would be too high.

## 2019-12-13 ENCOUNTER — Telehealth: Payer: Self-pay | Admitting: Orthopaedic Surgery

## 2019-12-13 NOTE — Telephone Encounter (Signed)
Would typically not make worse.  If injection with hilts did not help, xu would like to see back if still in pain

## 2019-12-13 NOTE — Telephone Encounter (Signed)
Patient called  He says he was recently prescribed pennsaid but now reports his pain levels as worse. He wanted to discuss this with someone to know if it was normal.   Call back number: [phone]

## 2019-12-15 NOTE — Telephone Encounter (Signed)
Needs appt with Dr Roda Shutters.

## 2019-12-15 NOTE — Telephone Encounter (Signed)
Called patient no answer. LMOM to return our call. ? ?

## 2020-01-30 ENCOUNTER — Telehealth: Payer: Self-pay | Admitting: Orthopaedic Surgery

## 2020-01-30 NOTE — Telephone Encounter (Signed)
Patient called, requesting shoulder xray on a CD to take to [facility]. Please call when ready. I advised him to sign release when comes to pick up.callback [phone]

## 2020-01-30 NOTE — Telephone Encounter (Signed)
Left voicemail advising patient CD was ready for pickup at front desk.  

## 2020-02-01 DIAGNOSIS — K080 Exfoliation of teeth due to systemic causes: Secondary | ICD-10-CM | POA: Diagnosis not present

## 2020-02-08 DIAGNOSIS — M25512 Pain in left shoulder: Secondary | ICD-10-CM | POA: Diagnosis not present

## 2020-02-08 DIAGNOSIS — M7532 Calcific tendinitis of left shoulder: Secondary | ICD-10-CM | POA: Diagnosis not present

## 2020-02-19 DIAGNOSIS — M25512 Pain in left shoulder: Secondary | ICD-10-CM | POA: Diagnosis not present

## 2020-03-05 DIAGNOSIS — M7532 Calcific tendinitis of left shoulder: Secondary | ICD-10-CM | POA: Diagnosis not present

## 2020-03-05 DIAGNOSIS — M25512 Pain in left shoulder: Secondary | ICD-10-CM | POA: Diagnosis not present

## 2020-03-25 DIAGNOSIS — M7532 Calcific tendinitis of left shoulder: Secondary | ICD-10-CM | POA: Diagnosis not present

## 2020-04-10 DIAGNOSIS — E782 Mixed hyperlipidemia: Secondary | ICD-10-CM | POA: Diagnosis not present

## 2020-04-10 DIAGNOSIS — E1169 Type 2 diabetes mellitus with other specified complication: Secondary | ICD-10-CM | POA: Diagnosis not present

## 2020-04-10 DIAGNOSIS — E039 Hypothyroidism, unspecified: Secondary | ICD-10-CM | POA: Diagnosis not present

## 2020-04-10 DIAGNOSIS — E669 Obesity, unspecified: Secondary | ICD-10-CM | POA: Diagnosis not present

## 2020-04-30 DIAGNOSIS — M75102 Unspecified rotator cuff tear or rupture of left shoulder, not specified as traumatic: Secondary | ICD-10-CM | POA: Diagnosis not present

## 2020-04-30 DIAGNOSIS — M19012 Primary osteoarthritis, left shoulder: Secondary | ICD-10-CM | POA: Diagnosis not present

## 2020-04-30 DIAGNOSIS — M25512 Pain in left shoulder: Secondary | ICD-10-CM | POA: Diagnosis not present

## 2020-05-17 DIAGNOSIS — H9203 Otalgia, bilateral: Secondary | ICD-10-CM | POA: Diagnosis not present

## 2020-07-22 DIAGNOSIS — Y999 Unspecified external cause status: Secondary | ICD-10-CM | POA: Diagnosis not present

## 2020-07-22 DIAGNOSIS — M19012 Primary osteoarthritis, left shoulder: Secondary | ICD-10-CM | POA: Diagnosis not present

## 2020-07-22 DIAGNOSIS — M659 Synovitis and tenosynovitis, unspecified: Secondary | ICD-10-CM | POA: Diagnosis not present

## 2020-07-22 DIAGNOSIS — G8918 Other acute postprocedural pain: Secondary | ICD-10-CM | POA: Diagnosis not present

## 2020-07-22 DIAGNOSIS — X58XXXA Exposure to other specified factors, initial encounter: Secondary | ICD-10-CM | POA: Diagnosis not present

## 2020-07-22 DIAGNOSIS — M65812 Other synovitis and tenosynovitis, left shoulder: Secondary | ICD-10-CM | POA: Diagnosis not present

## 2020-07-22 DIAGNOSIS — S43432A Superior glenoid labrum lesion of left shoulder, initial encounter: Secondary | ICD-10-CM | POA: Diagnosis not present

## 2020-07-22 DIAGNOSIS — S46012A Strain of muscle(s) and tendon(s) of the rotator cuff of left shoulder, initial encounter: Secondary | ICD-10-CM | POA: Diagnosis not present

## 2020-07-22 DIAGNOSIS — M25712 Osteophyte, left shoulder: Secondary | ICD-10-CM | POA: Diagnosis not present

## 2020-07-22 DIAGNOSIS — S46112A Strain of muscle, fascia and tendon of long head of biceps, left arm, initial encounter: Secondary | ICD-10-CM | POA: Diagnosis not present

## 2020-07-25 DIAGNOSIS — Z4789 Encounter for other orthopedic aftercare: Secondary | ICD-10-CM | POA: Diagnosis not present

## 2020-07-25 DIAGNOSIS — M25512 Pain in left shoulder: Secondary | ICD-10-CM | POA: Diagnosis not present

## 2020-07-30 DIAGNOSIS — Z4789 Encounter for other orthopedic aftercare: Secondary | ICD-10-CM | POA: Diagnosis not present

## 2020-07-30 DIAGNOSIS — M25512 Pain in left shoulder: Secondary | ICD-10-CM | POA: Diagnosis not present

## 2020-08-02 DIAGNOSIS — M25512 Pain in left shoulder: Secondary | ICD-10-CM | POA: Diagnosis not present

## 2020-08-02 DIAGNOSIS — Z4789 Encounter for other orthopedic aftercare: Secondary | ICD-10-CM | POA: Diagnosis not present

## 2020-08-06 DIAGNOSIS — Z4789 Encounter for other orthopedic aftercare: Secondary | ICD-10-CM | POA: Diagnosis not present

## 2020-08-06 DIAGNOSIS — M25512 Pain in left shoulder: Secondary | ICD-10-CM | POA: Diagnosis not present

## 2020-08-08 DIAGNOSIS — M25512 Pain in left shoulder: Secondary | ICD-10-CM | POA: Diagnosis not present

## 2020-08-08 DIAGNOSIS — Z4789 Encounter for other orthopedic aftercare: Secondary | ICD-10-CM | POA: Diagnosis not present

## 2020-08-13 DIAGNOSIS — M25512 Pain in left shoulder: Secondary | ICD-10-CM | POA: Diagnosis not present

## 2020-08-13 DIAGNOSIS — Z4789 Encounter for other orthopedic aftercare: Secondary | ICD-10-CM | POA: Diagnosis not present

## 2020-08-15 DIAGNOSIS — Z4789 Encounter for other orthopedic aftercare: Secondary | ICD-10-CM | POA: Diagnosis not present

## 2020-08-15 DIAGNOSIS — M25512 Pain in left shoulder: Secondary | ICD-10-CM | POA: Diagnosis not present

## 2020-08-20 DIAGNOSIS — M25512 Pain in left shoulder: Secondary | ICD-10-CM | POA: Diagnosis not present

## 2020-08-20 DIAGNOSIS — Z4789 Encounter for other orthopedic aftercare: Secondary | ICD-10-CM | POA: Diagnosis not present

## 2020-08-22 DIAGNOSIS — Z4789 Encounter for other orthopedic aftercare: Secondary | ICD-10-CM | POA: Diagnosis not present

## 2020-08-22 DIAGNOSIS — M25512 Pain in left shoulder: Secondary | ICD-10-CM | POA: Diagnosis not present

## 2020-08-27 DIAGNOSIS — M25512 Pain in left shoulder: Secondary | ICD-10-CM | POA: Diagnosis not present

## 2020-08-27 DIAGNOSIS — Z4789 Encounter for other orthopedic aftercare: Secondary | ICD-10-CM | POA: Diagnosis not present

## 2020-08-30 DIAGNOSIS — M25512 Pain in left shoulder: Secondary | ICD-10-CM | POA: Diagnosis not present

## 2020-08-30 DIAGNOSIS — Z4789 Encounter for other orthopedic aftercare: Secondary | ICD-10-CM | POA: Diagnosis not present

## 2020-09-03 DIAGNOSIS — Z4789 Encounter for other orthopedic aftercare: Secondary | ICD-10-CM | POA: Diagnosis not present

## 2020-09-03 DIAGNOSIS — M25512 Pain in left shoulder: Secondary | ICD-10-CM | POA: Diagnosis not present

## 2020-09-06 DIAGNOSIS — Z20822 Contact with and (suspected) exposure to covid-19: Secondary | ICD-10-CM | POA: Diagnosis not present

## 2020-09-10 DIAGNOSIS — Z4789 Encounter for other orthopedic aftercare: Secondary | ICD-10-CM | POA: Diagnosis not present

## 2020-09-10 DIAGNOSIS — M25512 Pain in left shoulder: Secondary | ICD-10-CM | POA: Diagnosis not present

## 2020-09-12 DIAGNOSIS — M25512 Pain in left shoulder: Secondary | ICD-10-CM | POA: Diagnosis not present

## 2020-09-12 DIAGNOSIS — Z4789 Encounter for other orthopedic aftercare: Secondary | ICD-10-CM | POA: Diagnosis not present

## 2020-09-17 DIAGNOSIS — M25512 Pain in left shoulder: Secondary | ICD-10-CM | POA: Diagnosis not present

## 2020-09-17 DIAGNOSIS — Z4789 Encounter for other orthopedic aftercare: Secondary | ICD-10-CM | POA: Diagnosis not present

## 2020-09-19 DIAGNOSIS — Z4789 Encounter for other orthopedic aftercare: Secondary | ICD-10-CM | POA: Diagnosis not present

## 2020-09-19 DIAGNOSIS — M25512 Pain in left shoulder: Secondary | ICD-10-CM | POA: Diagnosis not present

## 2020-09-24 DIAGNOSIS — M25512 Pain in left shoulder: Secondary | ICD-10-CM | POA: Diagnosis not present

## 2020-09-24 DIAGNOSIS — Z4789 Encounter for other orthopedic aftercare: Secondary | ICD-10-CM | POA: Diagnosis not present

## 2020-09-26 DIAGNOSIS — M25512 Pain in left shoulder: Secondary | ICD-10-CM | POA: Diagnosis not present

## 2020-09-26 DIAGNOSIS — Z4789 Encounter for other orthopedic aftercare: Secondary | ICD-10-CM | POA: Diagnosis not present

## 2020-09-30 DIAGNOSIS — M25512 Pain in left shoulder: Secondary | ICD-10-CM | POA: Diagnosis not present

## 2020-09-30 DIAGNOSIS — Z4789 Encounter for other orthopedic aftercare: Secondary | ICD-10-CM | POA: Diagnosis not present

## 2020-10-03 DIAGNOSIS — M25512 Pain in left shoulder: Secondary | ICD-10-CM | POA: Diagnosis not present

## 2020-10-03 DIAGNOSIS — Z4789 Encounter for other orthopedic aftercare: Secondary | ICD-10-CM | POA: Diagnosis not present

## 2020-10-08 DIAGNOSIS — Z4789 Encounter for other orthopedic aftercare: Secondary | ICD-10-CM | POA: Diagnosis not present

## 2020-10-08 DIAGNOSIS — M25512 Pain in left shoulder: Secondary | ICD-10-CM | POA: Diagnosis not present

## 2020-10-14 DIAGNOSIS — M25512 Pain in left shoulder: Secondary | ICD-10-CM | POA: Diagnosis not present

## 2020-10-14 DIAGNOSIS — Z4789 Encounter for other orthopedic aftercare: Secondary | ICD-10-CM | POA: Diagnosis not present

## 2020-10-22 DIAGNOSIS — Z4789 Encounter for other orthopedic aftercare: Secondary | ICD-10-CM | POA: Diagnosis not present

## 2020-10-22 DIAGNOSIS — M25512 Pain in left shoulder: Secondary | ICD-10-CM | POA: Diagnosis not present

## 2020-10-30 DIAGNOSIS — Z23 Encounter for immunization: Secondary | ICD-10-CM | POA: Diagnosis not present

## 2020-10-30 DIAGNOSIS — E039 Hypothyroidism, unspecified: Secondary | ICD-10-CM | POA: Diagnosis not present

## 2020-10-30 DIAGNOSIS — Z0000 Encounter for general adult medical examination without abnormal findings: Secondary | ICD-10-CM | POA: Diagnosis not present

## 2020-10-30 DIAGNOSIS — E1169 Type 2 diabetes mellitus with other specified complication: Secondary | ICD-10-CM | POA: Diagnosis not present

## 2020-10-30 DIAGNOSIS — Z125 Encounter for screening for malignant neoplasm of prostate: Secondary | ICD-10-CM | POA: Diagnosis not present

## 2020-10-30 DIAGNOSIS — E782 Mixed hyperlipidemia: Secondary | ICD-10-CM | POA: Diagnosis not present

## 2020-11-05 DIAGNOSIS — H2513 Age-related nuclear cataract, bilateral: Secondary | ICD-10-CM | POA: Diagnosis not present

## 2020-11-05 DIAGNOSIS — H5213 Myopia, bilateral: Secondary | ICD-10-CM | POA: Diagnosis not present

## 2020-11-05 DIAGNOSIS — E119 Type 2 diabetes mellitus without complications: Secondary | ICD-10-CM | POA: Diagnosis not present

## 2020-11-21 DIAGNOSIS — Z1159 Encounter for screening for other viral diseases: Secondary | ICD-10-CM | POA: Diagnosis not present

## 2020-11-26 DIAGNOSIS — K640 First degree hemorrhoids: Secondary | ICD-10-CM | POA: Diagnosis not present

## 2020-11-26 DIAGNOSIS — Z1211 Encounter for screening for malignant neoplasm of colon: Secondary | ICD-10-CM | POA: Diagnosis not present

## 2020-11-26 DIAGNOSIS — K5730 Diverticulosis of large intestine without perforation or abscess without bleeding: Secondary | ICD-10-CM | POA: Diagnosis not present

## 2021-05-13 DIAGNOSIS — E039 Hypothyroidism, unspecified: Secondary | ICD-10-CM | POA: Diagnosis not present

## 2021-05-13 DIAGNOSIS — E1169 Type 2 diabetes mellitus with other specified complication: Secondary | ICD-10-CM | POA: Diagnosis not present

## 2021-05-13 DIAGNOSIS — E782 Mixed hyperlipidemia: Secondary | ICD-10-CM | POA: Diagnosis not present

## 2021-11-06 DIAGNOSIS — Z20822 Contact with and (suspected) exposure to covid-19: Secondary | ICD-10-CM | POA: Diagnosis not present

## 2021-11-06 DIAGNOSIS — U071 COVID-19: Secondary | ICD-10-CM | POA: Diagnosis not present

## 2021-12-16 DIAGNOSIS — H2510 Age-related nuclear cataract, unspecified eye: Secondary | ICD-10-CM | POA: Diagnosis not present

## 2021-12-16 DIAGNOSIS — H43393 Other vitreous opacities, bilateral: Secondary | ICD-10-CM | POA: Diagnosis not present

## 2021-12-17 DIAGNOSIS — E782 Mixed hyperlipidemia: Secondary | ICD-10-CM | POA: Diagnosis not present

## 2021-12-17 DIAGNOSIS — E039 Hypothyroidism, unspecified: Secondary | ICD-10-CM | POA: Diagnosis not present

## 2021-12-17 DIAGNOSIS — Z0000 Encounter for general adult medical examination without abnormal findings: Secondary | ICD-10-CM | POA: Diagnosis not present

## 2021-12-17 DIAGNOSIS — Z125 Encounter for screening for malignant neoplasm of prostate: Secondary | ICD-10-CM | POA: Diagnosis not present

## 2021-12-17 DIAGNOSIS — E1169 Type 2 diabetes mellitus with other specified complication: Secondary | ICD-10-CM | POA: Diagnosis not present

## 2022-01-26 DIAGNOSIS — Z23 Encounter for immunization: Secondary | ICD-10-CM | POA: Diagnosis not present

## 2022-03-31 DIAGNOSIS — Z23 Encounter for immunization: Secondary | ICD-10-CM | POA: Diagnosis not present

## 2022-06-18 DIAGNOSIS — E1169 Type 2 diabetes mellitus with other specified complication: Secondary | ICD-10-CM | POA: Diagnosis not present

## 2022-06-18 DIAGNOSIS — K219 Gastro-esophageal reflux disease without esophagitis: Secondary | ICD-10-CM | POA: Diagnosis not present

## 2022-06-18 DIAGNOSIS — E782 Mixed hyperlipidemia: Secondary | ICD-10-CM | POA: Diagnosis not present

## 2022-06-18 DIAGNOSIS — E669 Obesity, unspecified: Secondary | ICD-10-CM | POA: Diagnosis not present

## 2022-06-18 DIAGNOSIS — E039 Hypothyroidism, unspecified: Secondary | ICD-10-CM | POA: Diagnosis not present

## 2022-07-07 DIAGNOSIS — A048 Other specified bacterial intestinal infections: Secondary | ICD-10-CM | POA: Diagnosis not present

## 2022-07-30 DIAGNOSIS — R229 Localized swelling, mass and lump, unspecified: Secondary | ICD-10-CM | POA: Diagnosis not present

## 2022-09-12 DIAGNOSIS — Z23 Encounter for immunization: Secondary | ICD-10-CM | POA: Diagnosis not present

## 2022-12-18 DIAGNOSIS — E119 Type 2 diabetes mellitus without complications: Secondary | ICD-10-CM | POA: Diagnosis not present

## 2022-12-18 DIAGNOSIS — H53143 Visual discomfort, bilateral: Secondary | ICD-10-CM | POA: Diagnosis not present

## 2022-12-31 DIAGNOSIS — E1169 Type 2 diabetes mellitus with other specified complication: Secondary | ICD-10-CM | POA: Diagnosis not present

## 2022-12-31 DIAGNOSIS — E782 Mixed hyperlipidemia: Secondary | ICD-10-CM | POA: Diagnosis not present

## 2022-12-31 DIAGNOSIS — Z0000 Encounter for general adult medical examination without abnormal findings: Secondary | ICD-10-CM | POA: Diagnosis not present

## 2022-12-31 DIAGNOSIS — Z125 Encounter for screening for malignant neoplasm of prostate: Secondary | ICD-10-CM | POA: Diagnosis not present

## 2022-12-31 DIAGNOSIS — E039 Hypothyroidism, unspecified: Secondary | ICD-10-CM | POA: Diagnosis not present

## 2023-06-29 DIAGNOSIS — E039 Hypothyroidism, unspecified: Secondary | ICD-10-CM | POA: Diagnosis not present

## 2023-06-29 DIAGNOSIS — E1169 Type 2 diabetes mellitus with other specified complication: Secondary | ICD-10-CM | POA: Diagnosis not present

## 2023-06-29 DIAGNOSIS — E782 Mixed hyperlipidemia: Secondary | ICD-10-CM | POA: Diagnosis not present

## 2023-06-29 DIAGNOSIS — E669 Obesity, unspecified: Secondary | ICD-10-CM | POA: Diagnosis not present

## 2023-12-22 DIAGNOSIS — E119 Type 2 diabetes mellitus without complications: Secondary | ICD-10-CM | POA: Diagnosis not present

## 2023-12-22 DIAGNOSIS — H35033 Hypertensive retinopathy, bilateral: Secondary | ICD-10-CM | POA: Diagnosis not present

## 2024-01-06 DIAGNOSIS — Z0000 Encounter for general adult medical examination without abnormal findings: Secondary | ICD-10-CM | POA: Diagnosis not present

## 2024-01-06 DIAGNOSIS — Z125 Encounter for screening for malignant neoplasm of prostate: Secondary | ICD-10-CM | POA: Diagnosis not present

## 2024-01-06 DIAGNOSIS — Z23 Encounter for immunization: Secondary | ICD-10-CM | POA: Diagnosis not present

## 2024-01-06 DIAGNOSIS — E782 Mixed hyperlipidemia: Secondary | ICD-10-CM | POA: Diagnosis not present

## 2024-01-06 DIAGNOSIS — E1169 Type 2 diabetes mellitus with other specified complication: Secondary | ICD-10-CM | POA: Diagnosis not present

## 2024-01-06 DIAGNOSIS — E039 Hypothyroidism, unspecified: Secondary | ICD-10-CM | POA: Diagnosis not present

## 2024-03-24 DIAGNOSIS — R253 Fasciculation: Secondary | ICD-10-CM | POA: Diagnosis not present

## 2024-05-17 DIAGNOSIS — L089 Local infection of the skin and subcutaneous tissue, unspecified: Secondary | ICD-10-CM | POA: Diagnosis not present

## 2024-06-12 ENCOUNTER — Ambulatory Visit: Admitting: Neurology

## 2024-07-07 DIAGNOSIS — E782 Mixed hyperlipidemia: Secondary | ICD-10-CM | POA: Diagnosis not present

## 2024-07-07 DIAGNOSIS — E669 Obesity, unspecified: Secondary | ICD-10-CM | POA: Diagnosis not present

## 2024-07-07 DIAGNOSIS — R253 Fasciculation: Secondary | ICD-10-CM | POA: Diagnosis not present

## 2024-07-07 DIAGNOSIS — E1169 Type 2 diabetes mellitus with other specified complication: Secondary | ICD-10-CM | POA: Diagnosis not present

## 2024-07-07 DIAGNOSIS — E039 Hypothyroidism, unspecified: Secondary | ICD-10-CM | POA: Diagnosis not present

## 2024-08-01 ENCOUNTER — Ambulatory Visit: Admitting: Diagnostic Neuroimaging
